# Patient Record
Sex: Female | Born: 1995 | Race: White | Hispanic: No | Smoking: Never smoker
Health system: Southern US, Community
[De-identification: ages and names within clinical notes are randomized; demographics above are authoritative.]

## PROBLEM LIST (undated history)

## (undated) ENCOUNTER — Inpatient Hospital Stay (HOSPITAL_COMMUNITY): Payer: Self-pay

## (undated) DIAGNOSIS — K529 Noninfective gastroenteritis and colitis, unspecified: Secondary | ICD-10-CM

## (undated) DIAGNOSIS — F909 Attention-deficit hyperactivity disorder, unspecified type: Secondary | ICD-10-CM

## (undated) DIAGNOSIS — E162 Hypoglycemia, unspecified: Secondary | ICD-10-CM

## (undated) HISTORY — PX: FRACTURE SURGERY: SHX138

## (undated) HISTORY — DX: Noninfective gastroenteritis and colitis, unspecified: K52.9

## (undated) HISTORY — DX: Attention-deficit hyperactivity disorder, unspecified type: F90.9

---
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
## 2017-06-08 ENCOUNTER — Inpatient Hospital Stay (HOSPITAL_COMMUNITY)
Admission: AD | Admit: 2017-06-08 | Discharge: 2017-06-08 | Discharge status: Against Medical Advice | Payer: BC Managed Care – PPO | Source: Ambulatory Visit | Attending: Obstetrics and Gynecology | Admitting: Obstetrics and Gynecology

## 2017-06-08 ENCOUNTER — Encounter (HOSPITAL_COMMUNITY): Payer: Self-pay

## 2017-06-08 ENCOUNTER — Inpatient Hospital Stay (HOSPITAL_COMMUNITY): Payer: BC Managed Care – PPO

## 2017-06-08 DIAGNOSIS — Z5321 Procedure and treatment not carried out due to patient leaving prior to being seen by health care provider: Secondary | ICD-10-CM | POA: Diagnosis not present

## 2017-06-08 DIAGNOSIS — Z3A00 Weeks of gestation of pregnancy not specified: Secondary | ICD-10-CM | POA: Diagnosis not present

## 2017-06-08 DIAGNOSIS — O26899 Other specified pregnancy related conditions, unspecified trimester: Secondary | ICD-10-CM

## 2017-06-08 DIAGNOSIS — R102 Pelvic and perineal pain: Secondary | ICD-10-CM | POA: Diagnosis not present

## 2017-06-08 LAB — URINALYSIS, ROUTINE W REFLEX MICROSCOPIC
Bilirubin Urine: NEGATIVE
Glucose, UA: NEGATIVE mg/dL
HGB URINE DIPSTICK: NEGATIVE
Ketones, ur: NEGATIVE mg/dL
Nitrite: NEGATIVE
Protein, ur: NEGATIVE mg/dL
SPECIFIC GRAVITY, URINE: 1.009 (ref 1.005–1.030)
pH: 7 (ref 5.0–8.0)

## 2017-06-08 LAB — POCT PREGNANCY, URINE: Preg Test, Ur: POSITIVE — AB

## 2017-06-08 NOTE — MAU Note (Signed)
Went to [facility] in September, they could only see gestational sac and wasn't sure how far along I was.  Having back pain x 1 week and low abd cramps x 2 days. No bleeding. [facility] wanted me to come to make sure not having ectopic pregnancy.

## 2017-06-08 NOTE — Progress Notes (Signed)
Pt signed AMA paper, stated she had to get to work tonight.

## 2017-06-15 ENCOUNTER — Inpatient Hospital Stay (HOSPITAL_COMMUNITY): Payer: BC Managed Care – PPO

## 2017-06-15 ENCOUNTER — Inpatient Hospital Stay (HOSPITAL_COMMUNITY)
Admission: AD | Admit: 2017-06-15 | Discharge: 2017-06-15 | Disposition: A | Discharge status: Home / Self Care | Payer: BC Managed Care – PPO | Source: Ambulatory Visit | Attending: Family Medicine | Admitting: Family Medicine

## 2017-06-15 ENCOUNTER — Encounter (HOSPITAL_COMMUNITY): Payer: Self-pay | Admitting: *Deleted

## 2017-06-15 DIAGNOSIS — N939 Abnormal uterine and vaginal bleeding, unspecified: Secondary | ICD-10-CM | POA: Diagnosis present

## 2017-06-15 DIAGNOSIS — Z3490 Encounter for supervision of normal pregnancy, unspecified, unspecified trimester: Secondary | ICD-10-CM

## 2017-06-15 DIAGNOSIS — O26891 Other specified pregnancy related conditions, first trimester: Secondary | ICD-10-CM | POA: Diagnosis not present

## 2017-06-15 DIAGNOSIS — Z3A01 Less than 8 weeks gestation of pregnancy: Secondary | ICD-10-CM | POA: Diagnosis not present

## 2017-06-15 DIAGNOSIS — R102 Pelvic and perineal pain: Secondary | ICD-10-CM | POA: Diagnosis not present

## 2017-06-15 DIAGNOSIS — O209 Hemorrhage in early pregnancy, unspecified: Secondary | ICD-10-CM | POA: Insufficient documentation

## 2017-06-15 LAB — COMPREHENSIVE METABOLIC PANEL
ALBUMIN: 4.5 g/dL (ref 3.5–5.0)
ALT: 15 U/L (ref 14–54)
ANION GAP: 8 (ref 5–15)
AST: 13 U/L — AB (ref 15–41)
Alkaline Phosphatase: 41 U/L (ref 38–126)
BUN: 12 mg/dL (ref 6–20)
CHLORIDE: 103 mmol/L (ref 101–111)
CO2: 24 mmol/L (ref 22–32)
Calcium: 9.1 mg/dL (ref 8.9–10.3)
Creatinine, Ser: 0.55 mg/dL (ref 0.44–1.00)
GFR calc Af Amer: >60 mL/min (ref >60)
GFR calc non Af Amer: >60 mL/min (ref >60)
GLUCOSE: 100 mg/dL — AB (ref 65–99)
POTASSIUM: 3.8 mmol/L (ref 3.5–5.1)
SODIUM: 135 mmol/L (ref 135–145)
Total Bilirubin: 0.4 mg/dL (ref 0.3–1.2)
Total Protein: 7.6 g/dL (ref 6.5–8.1)

## 2017-06-15 LAB — CBC
HEMATOCRIT: 36.6 % (ref 36.0–46.0)
Hemoglobin: 12.6 g/dL (ref 12.0–15.0)
MCH: 30.4 pg (ref 26.0–34.0)
MCHC: 34.4 g/dL (ref 30.0–36.0)
MCV: 88.2 fL (ref 78.0–100.0)
PLATELETS: 164 10*3/uL (ref 150–400)
RBC: 4.15 MIL/uL (ref 3.87–5.11)
RDW: 12.9 % (ref 11.5–15.5)
WBC: 11.2 10*3/uL — AB (ref 4.0–10.5)

## 2017-06-15 NOTE — Discharge Instructions (Signed)
Safe Medications in Pregnancy  ° °Acne: °Benzoyl Peroxide °Salicylic Acid ° °Backache/Headache: °Tylenol: 2 regular strength every 4 hours OR °             2 Extra strength every 6 hours ° °Colds/Coughs/Allergies: °Benadryl (alcohol free) 25 mg every 6 hours as needed °Breath right strips °Claritin °Cepacol throat lozenges °Chloraseptic throat spray °Cold-Eeze- up to three times per day °Cough drops, alcohol free °Flonase (by prescription only) °Guaifenesin °Mucinex °Robitussin DM (plain only, alcohol free) °Saline nasal spray/drops °Sudafed (pseudoephedrine) & Actifed ** use only after [redacted] weeks gestation and if you do not have high blood pressure °Tylenol °Vicks Vaporub °Zinc lozenges °Zyrtec  ° °Constipation: °Colace °Ducolax suppositories °Fleet enema °Glycerin suppositories °Metamucil °Milk of magnesia °Miralax °Senokot °Smooth move tea ° °Diarrhea: °Kaopectate °Imodium A-D ° °*NO pepto Bismol ° °Hemorrhoids: °Anusol °Anusol HC °Preparation H °Tucks ° °Indigestion: °Tums °Maalox °Mylanta °Zantac  °Pepcid ° °Insomnia: °Benadryl (alcohol free) 25mg every 6 hours as needed °Tylenol PM °Unisom, no Gelcaps ° °Leg Cramps: °Tums °MagGel ° °Nausea/Vomiting:  °Bonine °Dramamine °Emetrol °Ginger extract °Sea bands °Meclizine  °Nausea medication to take during pregnancy:  °Unisom (doxylamine succinate 25 mg tablets) Take one tablet daily at bedtime. If symptoms are not adequately controlled, the dose can be increased to a maximum recommended dose of two tablets daily (1/2 tablet in the morning, 1/2 tablet mid-afternoon and one at bedtime). °Vitamin B6 100mg tablets. Take one tablet twice a day (up to 200 mg per day). ° °Skin Rashes: °Aveeno products °Benadryl cream or 25mg every 6 hours as needed °Calamine Lotion °1% cortisone cream ° °Yeast infection: °Gyne-lotrimin 7 °Monistat 7 ° ° °**If taking multiple medications, please check labels to avoid duplicating the same active ingredients °**take medication as directed on  the label °** Do not exceed 4000 mg of tylenol in 24 hours °**Do not take medications that contain aspirin or ibuprofen ° °Prenatal Care Providers °[facility]    [facility] ° Phone- [phone]     Phone: [phone] °         °[facility]                      [facility] ° @[place]     Phone: [phone] ° Phone: [phone] °        [facility] °[facility]     Phone: [phone] ° Phone: [phone]   °        [facility] °[facility] [place]                hone: [phone] ° Phone: [phone] °        [facility] °Dr. Bernard Marshall      Phone: [phone] ° Phone: [phone] °        [facility] °[facility]                Phone: [phone] ° Women’s Health  ° Phone:[phone]    [facility] ([place]) °         Phone: [phone] °[facility] °  Phone: [phone] ° °

## 2017-06-15 NOTE — MAU Note (Signed)
[facility] for a rule out ectopic.
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
## 2017-06-28 ENCOUNTER — Encounter: Payer: Self-pay | Admitting: Obstetrics and Gynecology

## 2017-09-30 ENCOUNTER — Inpatient Hospital Stay (HOSPITAL_COMMUNITY)
Admission: AD | Admit: 2017-09-30 | Discharge: 2017-09-30 | Disposition: A | Discharge status: Home / Self Care | Payer: Medicaid Other | Source: Ambulatory Visit | Attending: Obstetrics & Gynecology | Admitting: Obstetrics & Gynecology

## 2017-09-30 ENCOUNTER — Encounter (HOSPITAL_COMMUNITY): Payer: Self-pay | Admitting: *Deleted

## 2017-09-30 DIAGNOSIS — O26892 Other specified pregnancy related conditions, second trimester: Secondary | ICD-10-CM | POA: Insufficient documentation

## 2017-09-30 DIAGNOSIS — Z3A23 23 weeks gestation of pregnancy: Secondary | ICD-10-CM | POA: Diagnosis not present

## 2017-09-30 DIAGNOSIS — N898 Other specified noninflammatory disorders of vagina: Secondary | ICD-10-CM | POA: Insufficient documentation

## 2017-09-30 DIAGNOSIS — O9989 Other specified diseases and conditions complicating pregnancy, childbirth and the puerperium: Secondary | ICD-10-CM | POA: Diagnosis not present

## 2017-09-30 LAB — POCT FERN TEST: POCT Fern Test: NEGATIVE

## 2017-09-30 LAB — AMNISURE RUPTURE OF MEMBRANE (ROM) NOT AT ARMC: Amnisure ROM: NEGATIVE
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
## 2017-09-30 NOTE — Discharge Instructions (Signed)
Second Trimester of Pregnancy The second trimester is from week 13 through week 28, month 4 through 6. This is often the time in pregnancy that you feel your best. Often times, morning sickness has lessened or quit. You may have more energy, and you may get hungry more often. Your unborn baby (fetus) is growing rapidly. At the end of the sixth month, he or she is about 9 inches long and weighs about 1 pounds. You will likely feel the baby move (quickening) between 18 and 20 weeks of pregnancy. Follow these instructions at home:  Avoid all smoking, herbs, and alcohol. Avoid drugs not approved by your doctor.  Do not use any tobacco products, including cigarettes, chewing tobacco, and electronic cigarettes. If you need help quitting, ask your doctor. You may get counseling or other support to help you quit.  Only take medicine as told by your doctor. Some medicines are safe and some are not during pregnancy.  Exercise only as told by your doctor. Stop exercising if you start having cramps.  Eat regular, healthy meals.  Wear a good support bra if your breasts are tender.  Do not use hot tubs, steam rooms, or saunas.  Wear your seat belt when driving.  Avoid raw meat, uncooked cheese, and liter boxes and soil used by cats.  Take your prenatal vitamins.  Take 1500-2000 milligrams of calcium daily starting at the 20th week of pregnancy until you deliver your baby.  Try taking medicine that helps you poop (stool softener) as needed, and if your doctor approves. Eat more fiber by eating fresh fruit, vegetables, and whole grains. Drink enough fluids to keep your pee (urine) clear or pale yellow.  Take warm water baths (sitz baths) to soothe pain or discomfort caused by hemorrhoids. Use hemorrhoid cream if your doctor approves.  If you have puffy, bulging veins (varicose veins), wear support hose. Raise (elevate) your feet for 15 minutes, 3-4 times a day. Limit salt in your diet.  Avoid heavy  lifting, wear low heals, and sit up straight.  Rest with your legs raised if you have leg cramps or low back pain.  Visit your dentist if you have not gone during your pregnancy. Use a soft toothbrush to brush your teeth. Be gentle when you floss.  You can have sex (intercourse) unless your doctor tells you not to.  Go to your doctor visits. Get help if:  You feel dizzy.  You have mild cramps or pressure in your lower belly (abdomen).  You have a nagging pain in your belly area.  You continue to feel sick to your stomach (nauseous), throw up (vomit), or have watery poop (diarrhea).  You have bad smelling fluid coming from your vagina.  You have pain with peeing (urination). Get help right away if:  You have a fever.  You are leaking fluid from your vagina.  You have spotting or bleeding from your vagina.  You have severe belly cramping or pain.  You lose or gain weight rapidly.  You have trouble catching your breath and have chest pain.  You notice sudden or extreme puffiness (swelling) of your face, hands, ankles, feet, or legs.  You have not felt the baby move in over an hour.  You have severe headaches that do not go away with medicine.  You have vision changes. This information is not intended to replace advice given to you by your health care provider. Make sure you discuss any questions you have with your health care   provider. Document Released: 11/10/2009 Document Revised: 01/22/2016 Document Reviewed: 10/17/2012 Elsevier Interactive Patient Education  2017 [employer]  

## 2017-09-30 NOTE — MAU Note (Signed)
[facility]   Pt reports swelling in the vagina and then when she stood up from the bathroom and had a warm gush of fluid pt states the fluid was clear and did NOT smell like urine. Pt went to [facility] and they did a test and the fluid that they tested was POS for amniotic fluid. Pt denies bleeding. Pt reports good FM.

## 2018-04-21 ENCOUNTER — Encounter (HOSPITAL_COMMUNITY): Payer: Self-pay

## 2019-06-05 IMAGING — US US OB COMP LESS 14 WK
1 series · 15 of 28 positions shown · non-contrast
Comparison: None.

CLINICAL DATA: Left lower quadrant pain for 1 week. Bleeding.
Estimated gestational age by LMP is 12 weeks 6 days. Positive urine
pregnancy test. Quantitative beta HCG is 34,037

EXAM:
OBSTETRIC <14 WK US AND TRANSVAGINAL OB US
TECHNIQUE: Both transabdominal and transvaginal ultrasound examinations were
performed for complete evaluation of the gestation as well as the
maternal uterus, adnexal regions, and pelvic cul-de-sac.
Transvaginal technique was performed to assess early pregnancy.

[Series 1: us ob comp less 14 wk · 15 of 57 slices shown]
[im 1/57]
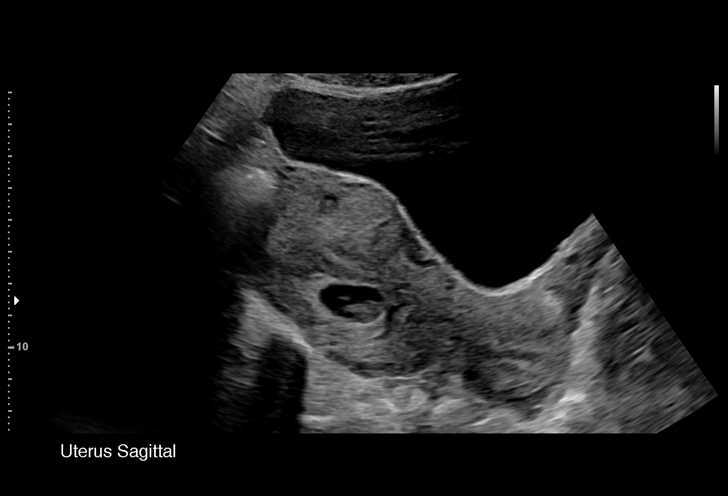
[im 5/57]
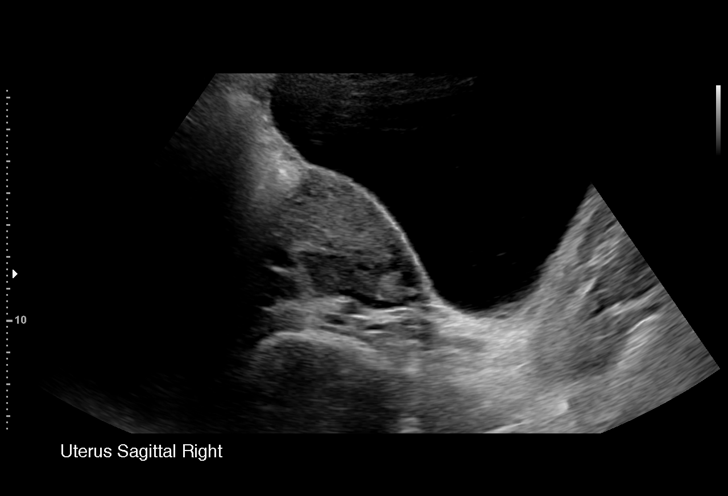
[im 9/57]
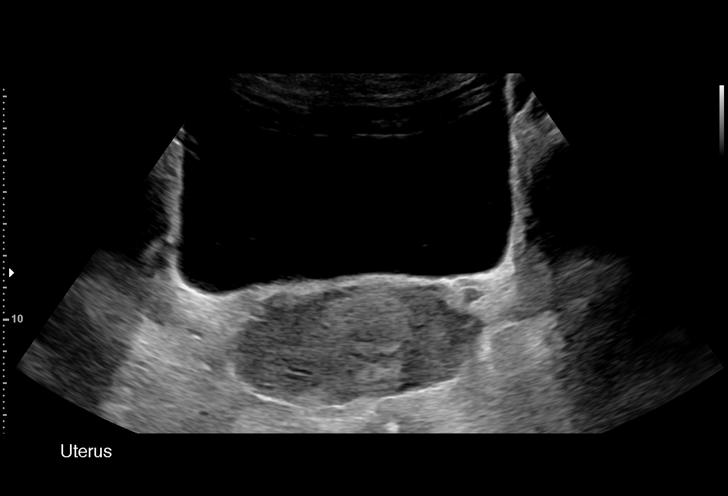
[im 13/57]
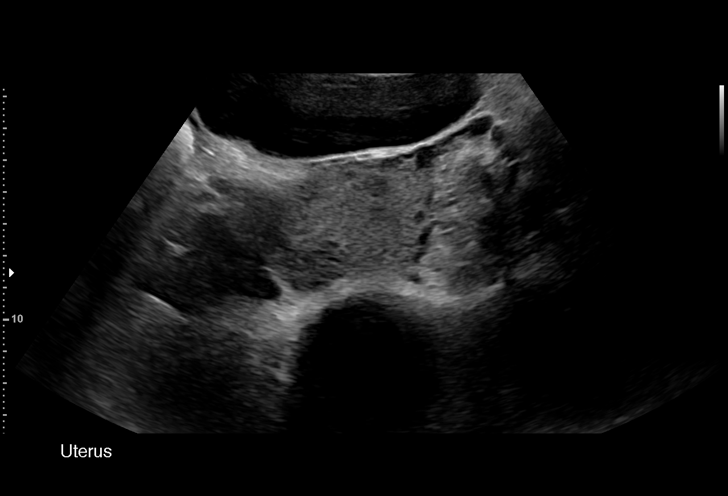
[im 17/57]
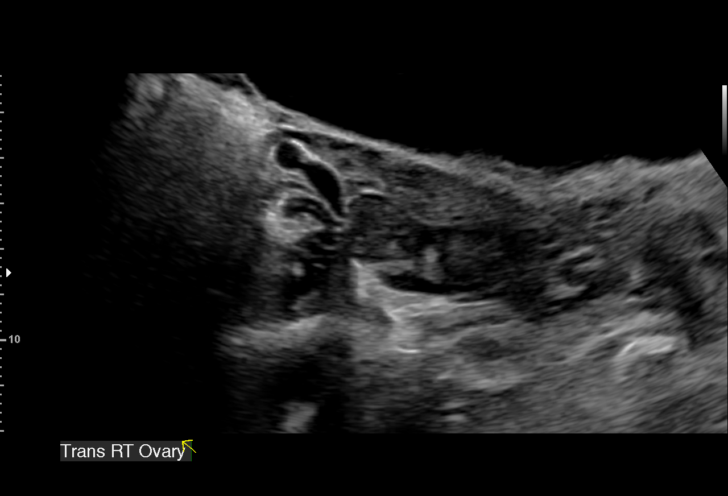
[im 21/57]
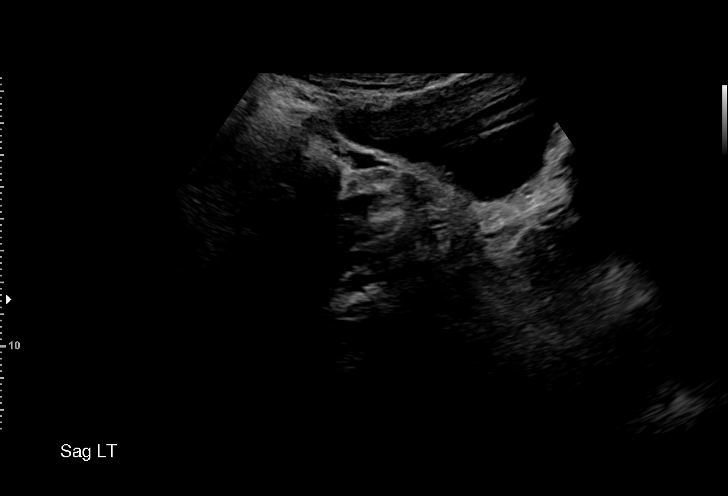
[im 25/57]
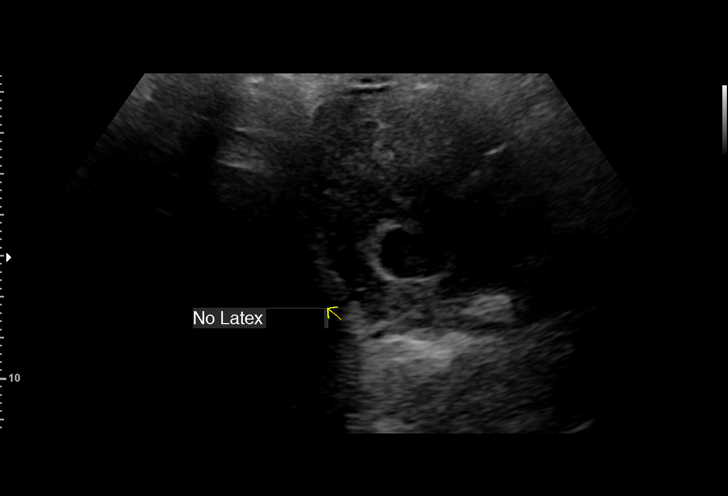
[im 30/57]
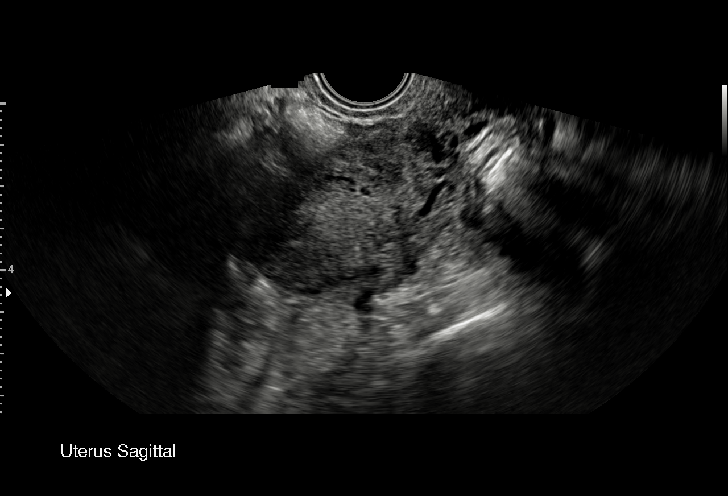
[im 32/57]
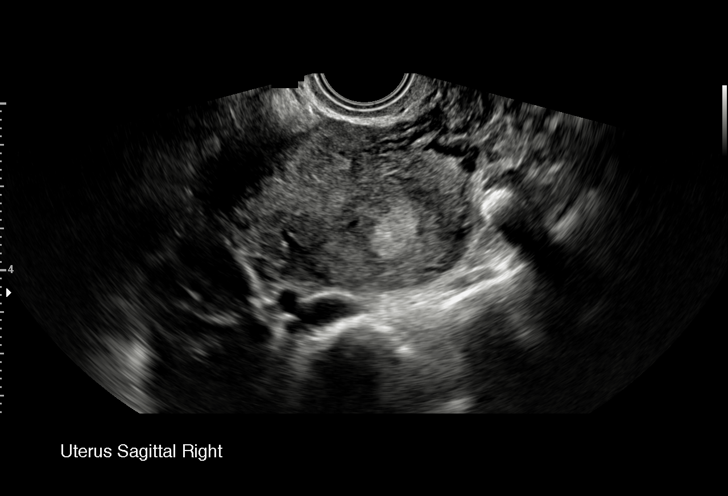
[im 36/57]
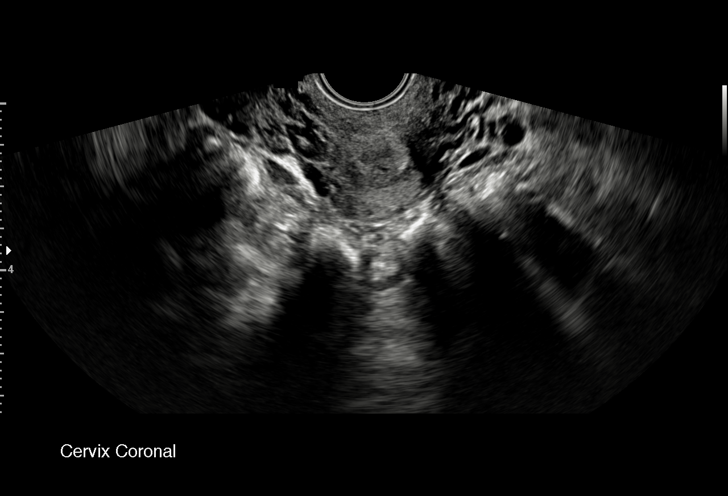
[im 40/57]
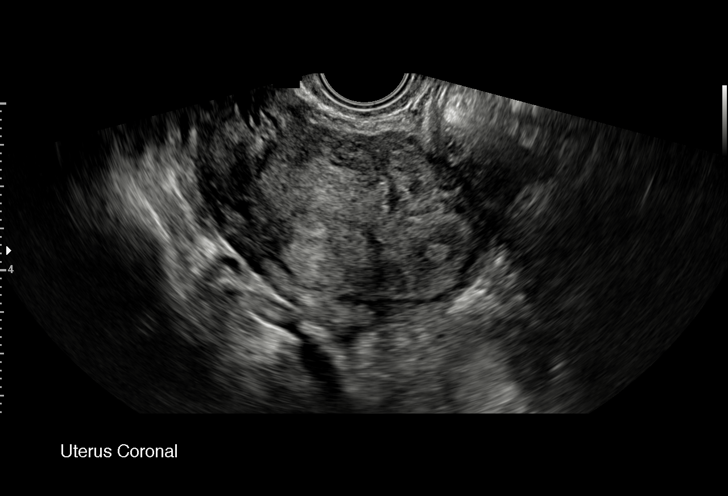
[im 44/57]
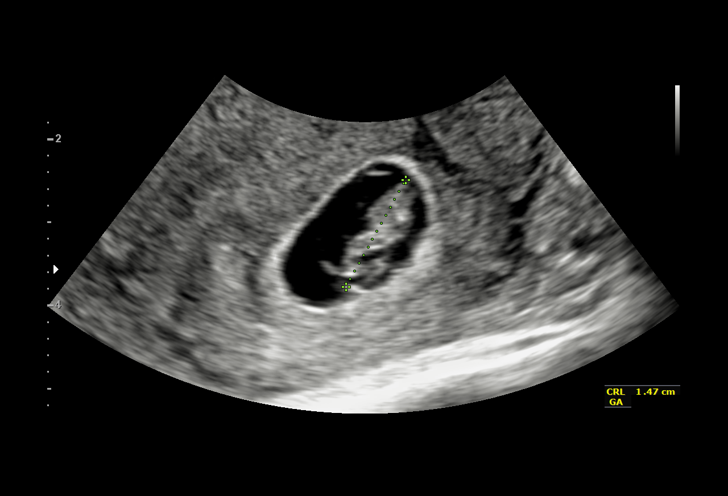
[im 48/57]
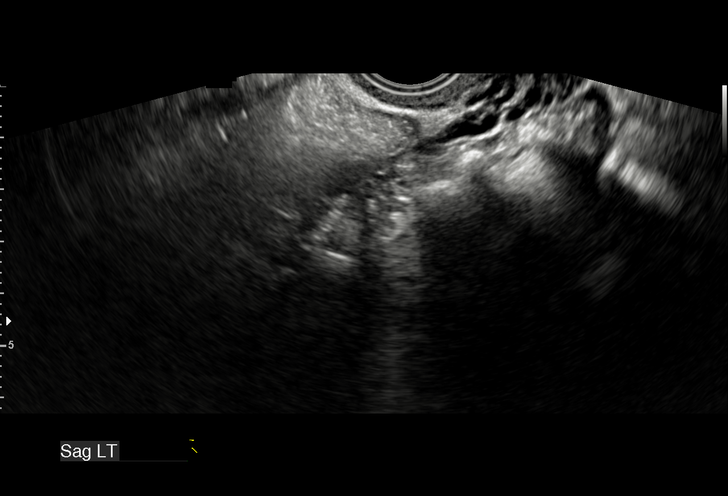
[im 52/57]
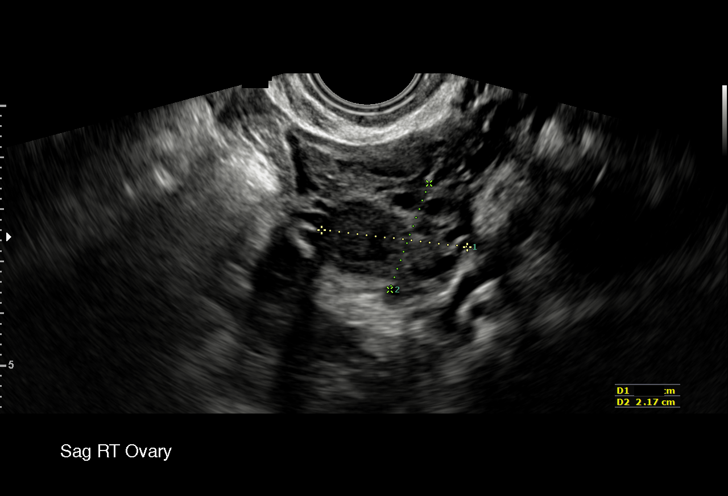
[im 57/57]
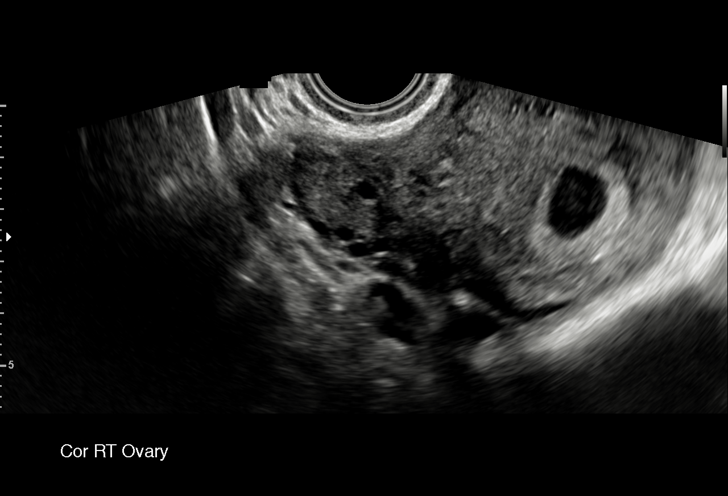

[15 of 28 positions shown; findings below may reference images not displayed]

FINDINGS: Intrauterine gestational sac: A single intrauterine gestational sac
is present.

Yolk sac:  Yolk sac is present.

Embryo:  Fetal pole is present.

Cardiac Activity: Fetal cardiac activity is observed.

Heart Rate: 164  bpm

CRL:  14.6  mm   7 w   5 d                  US EDC: 01/27/2018

Subchorionic hemorrhage: A minimal subchorionic hemorrhage is noted
inferiorly.

Maternal uterus/adnexae: The uterus is anteverted. Mild nodularity
to the anterior contour may indicate small fibroid. The right ovary
is visualized and appears normal. Left ovary is not identified. No
abnormality seen in the visualize left adnexum. No free fluid in the
pelvis.
IMPRESSION: Single intrauterine pregnancy. Estimated gestational age by
crown-rump length is 7 weeks 5 days. Minimal subchorionic hemorrhage
is present. Possible anterior uterine fibroid.
